# Patient Record
Sex: Male | Born: 1935 | Race: White | Hispanic: No | Smoking: Former smoker
Health system: Southern US, Community
[De-identification: ages and names within clinical notes are randomized; demographics above are authoritative.]

## PROBLEM LIST (undated history)

## (undated) HISTORY — PX: BACK SURGERY: SHX140

## (undated) HISTORY — PX: APPENDECTOMY: SHX54

## (undated) HISTORY — PX: HIP SURGERY: SHX245

## (undated) HISTORY — PX: KNEE SURGERY: SHX244

---

## 2018-12-22 ENCOUNTER — Other Ambulatory Visit: Payer: Self-pay

## 2018-12-22 ENCOUNTER — Emergency Department
Admission: EM | Admit: 2018-12-22 | Discharge: 2018-12-22 | Disposition: A | Discharge status: Home / Self Care | Payer: Medicare Other | Attending: Student | Admitting: Student

## 2018-12-22 ENCOUNTER — Emergency Department: Payer: Medicare Other

## 2018-12-22 ENCOUNTER — Encounter: Payer: Self-pay | Admitting: Emergency Medicine

## 2018-12-22 DIAGNOSIS — Y92129 Unspecified place in nursing home as the place of occurrence of the external cause: Secondary | ICD-10-CM | POA: Diagnosis not present

## 2018-12-22 DIAGNOSIS — Y999 Unspecified external cause status: Secondary | ICD-10-CM | POA: Diagnosis not present

## 2018-12-22 DIAGNOSIS — Y939 Activity, unspecified: Secondary | ICD-10-CM | POA: Insufficient documentation

## 2018-12-22 DIAGNOSIS — W010XXA Fall on same level from slipping, tripping and stumbling without subsequent striking against object, initial encounter: Secondary | ICD-10-CM | POA: Insufficient documentation

## 2018-12-22 DIAGNOSIS — W19XXXA Unspecified fall, initial encounter: Secondary | ICD-10-CM

## 2018-12-22 DIAGNOSIS — Z87891 Personal history of nicotine dependence: Secondary | ICD-10-CM | POA: Insufficient documentation

## 2018-12-22 DIAGNOSIS — S0990XA Unspecified injury of head, initial encounter: Secondary | ICD-10-CM | POA: Diagnosis present

## 2018-12-22 DIAGNOSIS — S0001XA Abrasion of scalp, initial encounter: Secondary | ICD-10-CM | POA: Insufficient documentation

## 2018-12-22 MED ORDER — ONDANSETRON 4 MG PO TBDP
4.0000 mg | ORAL_TABLET | Freq: Once | ORAL | Status: AC
  Administered 2018-12-22: 4 mg via ORAL
  Filled 2018-12-22: qty 1

## 2018-12-22 MED ORDER — ACETAMINOPHEN 500 MG PO TABS
1000.0000 mg | ORAL_TABLET | Freq: Once | ORAL | Status: AC
  Administered 2018-12-22: 08:00:00 1000 mg via ORAL
  Filled 2018-12-22: qty 2

## 2018-12-22 NOTE — ED Notes (Signed)
Attempted to call [facility] multiple times without answer.

## 2018-12-22 NOTE — ED Provider Notes (Signed)
[facility] Emergency Department Provider Note  ____________________________________________   First MD Initiated Contact with Patient 12/22/18 [phone]     (approximate)  I have reviewed the triage vital signs and the nursing notes.  History  Chief Complaint Fall    HPI Airik Goodlin is a 85 y.o. male who presents to the emergency department for a fall.  Patient states he was bending over to pick something up and lost his balance, falling backwards and hitting the posterior head.  He denies any loss of consciousness.  Patient states he wears bilateral leg braces and uses a walker due to a toe drop, and because of this he is often unsteady on his feet leading to falls.  He denies any preceding presyncopal symptoms, no chest pain, shortness of breath, lightheadedness, dizziness prior to the fall.  After the fall he has a mild headache and some associated lightheadedness.  Again, he denies any of these symptoms prior to the fall.  He is not on any blood thinning medications.  He denies any weakness, numbness, tingling, chest pain, neck pain, back pain, abdominal pain, or extremity pain.   Past Medical Hx History reviewed. No pertinent past medical history.  Problem List There are no active problems to display for this patient.   Past Surgical Hx Past Surgical History:  Procedure Laterality Date  . APPENDECTOMY    . BACK SURGERY    . HIP SURGERY Right   . KNEE SURGERY Right     Medications Prior to Admission medications   Not on File    Allergies Patient has no known allergies.  Family Hx History reviewed. No pertinent family history.  Social Hx Social History   Tobacco Use  . Smoking status: Former Games developer  . Smokeless tobacco: Never Used  Substance Use Topics  . Alcohol use: Never    Frequency: Never  . Drug use: Never     Review of Systems  Constitutional: Negative for fever, chills. Eyes: Negative for visual changes. ENT: Negative  for sore throat. Cardiovascular: Negative for chest pain. Respiratory: Negative for shortness of breath. Gastrointestinal: Negative for nausea, vomiting.  Genitourinary: Negative for dysuria. Musculoskeletal: Negative for leg swelling. Skin: + skin tear to left elbow Neurological: + for for headaches.   Physical Exam  Vital Signs: ED Triage Vitals  Enc Vitals Group     BP 12/22/18 0722 (!) 156/76     Pulse Rate 12/22/18 0722 65     Resp 12/22/18 0722 16     Temp 12/22/18 0722 97.7 F (36.5 C)     Temp Source 12/22/18 0722 Oral     SpO2 12/22/18 0722 97 %     Weight 12/22/18 0722 145 lb (65.8 kg)     Height 12/22/18 0722 5\' 6"  (1.676 m)     Head Circumference --      Peak Flow --      Pain Score 12/22/18 0723 5     Pain Loc --      Pain Edu? --      Excl. in GC? --     Constitutional: Alert and oriented.  Head: Normocephalic.  Mild abrasion to the center of the posterior occiput.  No lacerations. Eyes: Conjunctivae clear. Sclera anicteric. Nose: No congestion. No rhinorrhea. Mouth/Throat: Mucous membranes are moist.  Neck: No stridor.  No midline C-spine tenderness.  Full range of motion without discomfort. Cardiovascular: Normal rate, regular rhythm.  2+ symmetric radial pulses. Respiratory: Normal respiratory effort.  Lungs CTAB.  Chest: Chest wall stable and nontender.  No crepitance. Gastrointestinal: Soft. Non-tender. Non-distended.  Pelvis: Pelvis nontender and stable with AP and lateral compression. Musculoskeletal: No lower extremity edema. No deformities.  Full range of motion to bilateral shoulders, elbows, wrists, hips, knees.  Braces in place over bilateral ankles/feet. Back: No midline C/T/L-spine tenderness.  No step-offs or deformities. Neurologic:  Normal speech and language. No gross focal neurologic deficits are appreciated.  Skin: Very small skin tear to the right elbow area.  No lacerations amenable to suture repair. Psychiatric: Mood and affect are  appropriate for situation.  EKG  Personally reviewed.   Rate: 65 Rhythm: Sinus Axis: Normal Intervals: Within normal limits Sinus rhythm, no acute ischemic changes No STEMI    Radiology  CT head: IMPRESSION:  1. No acute intracranial findings.  2. Chronic microvascular ischemic change and cerebral volume loss.   CT EX:[id number]:  1. Limited exam.  2. No acute fracture or traumatic malalignment within the cervical spine.  3. Multilevel cervical spondylosis.   Procedures  Procedure(s) performed (including critical care):  Procedures   Initial Impression / Assessment and Plan / ED Course  85 y.o. male who presents to the ED for mechanical fall, as noted above.  On exam, he has a very small abrasion to the center of the posterior occiput and a small skin tear to the left elbow.   There are no lacerations amenable to suture repair.  We will plan for CT head and neck given his head injury.  He has full range of motion at the elbow, without deformities, and is neurovascularly intact, do not feel emergent imaging of this is indicated.  Patient is agreeable with the plan.  CT imaging negative for any acute traumatic injuries.  As such, will plan for discharge.  Given return precautions.  Patient voices understanding and is comfortable with the plan and discharge.    Final Clinical Impression(s) / ED Diagnosis  Final diagnoses:  Fall, initial encounter       Note:  This document was prepared using Dragon voice recognition software and may include unintentional dictation errors.   Lilia Pro., MD 12/22/18 [phone]

## 2018-12-22 NOTE — Discharge Instructions (Addendum)
Thank you for letting us take care of you in the emergency department today. Your CT scans were negative for any acute traumatic injuries.  Please continue to take any regular, prescribed medications.   Please return to the ER for any new or worsening symptoms.

## 2018-12-22 NOTE — ED Notes (Signed)
Patient transported to CT 

## 2018-12-22 NOTE — ED Notes (Signed)
Signature pad not working at this time.  Attempted multiple times.  Discharge instructions reviewed and patient verbalizes understanding with no further questions.

## 2018-12-22 NOTE — ED Triage Notes (Signed)
Pt to ED via EMS from [facility] c/o fall this morning.  States was bending over to pick something up and lost his balance, falling backwards and hit posterior head, denies LOC.  Pt denies being dizzy prior to fall but dizzy after and small abrasion to posterior head.  A&Ox4, takes a daily ASA, wears leg braces and uses walker at home and reports multiple falls and always unsteady on feet.

## 2018-12-22 NOTE — ED Notes (Signed)
This RN spoke with Stanton Kidney, daughter, with update and also spoke to [place] from [place] to give report and who stated will send transport for discharge.

## 2019-03-16 DEATH — deceased

## 2020-11-08 IMAGING — CT CT CERVICAL SPINE W/O CM
3 of 4 series · 9 of 33 positions shown, 11 images · non-contrast
Comparison: None.

CLINICAL DATA: Fall, neck pain

EXAM:
CT CERVICAL SPINE WITHOUT CONTRAST
TECHNIQUE: Multidetector CT imaging of the cervical spine was performed without
intravenous contrast. Multiplanar CT image reconstructions were also
generated.

[Series 5: sagittal bone · sagittal · 0.23mm/px · 5 of 58 slices shown, 6 images]
[im 20/58  bone]
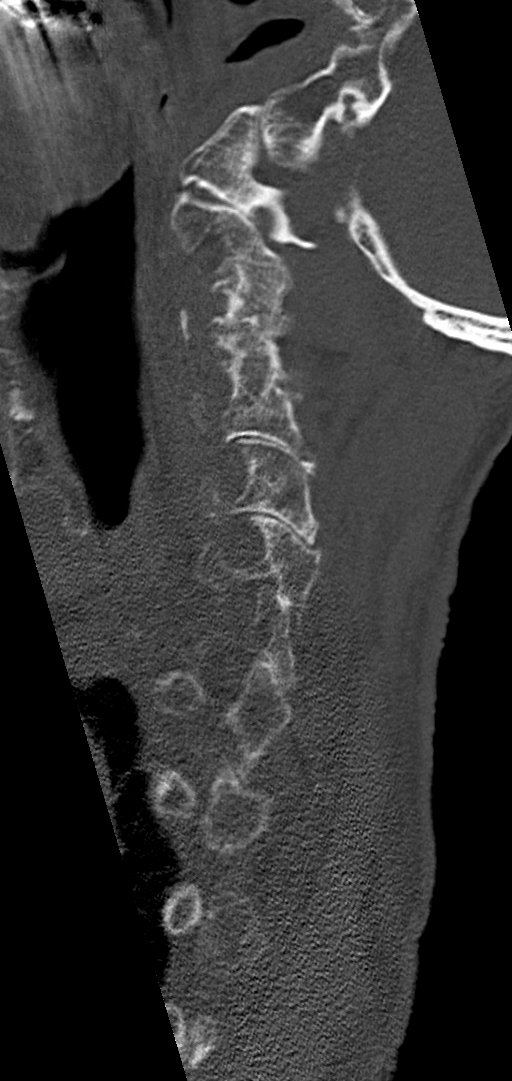
[im 24/58  bone]
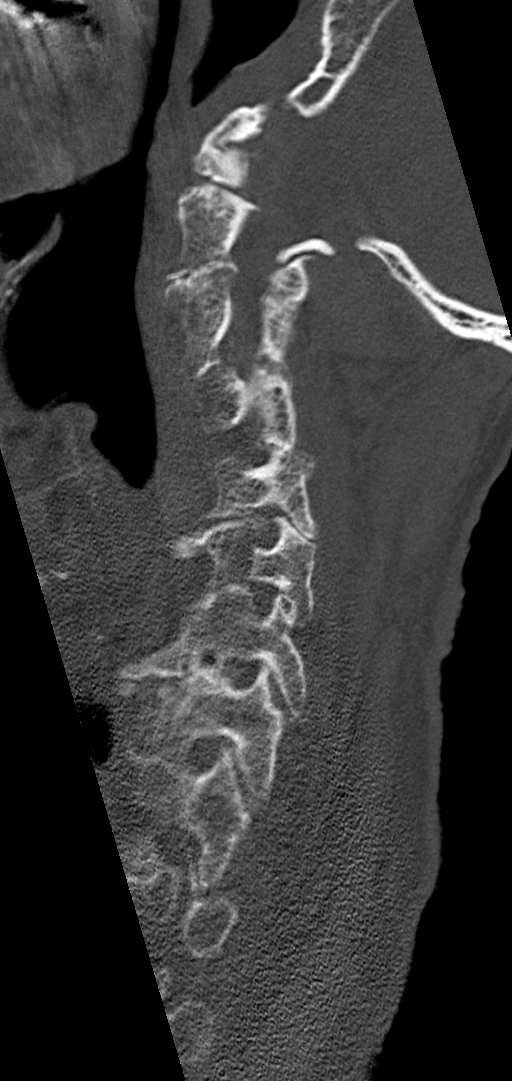
[im 29/58  soft-tissue]
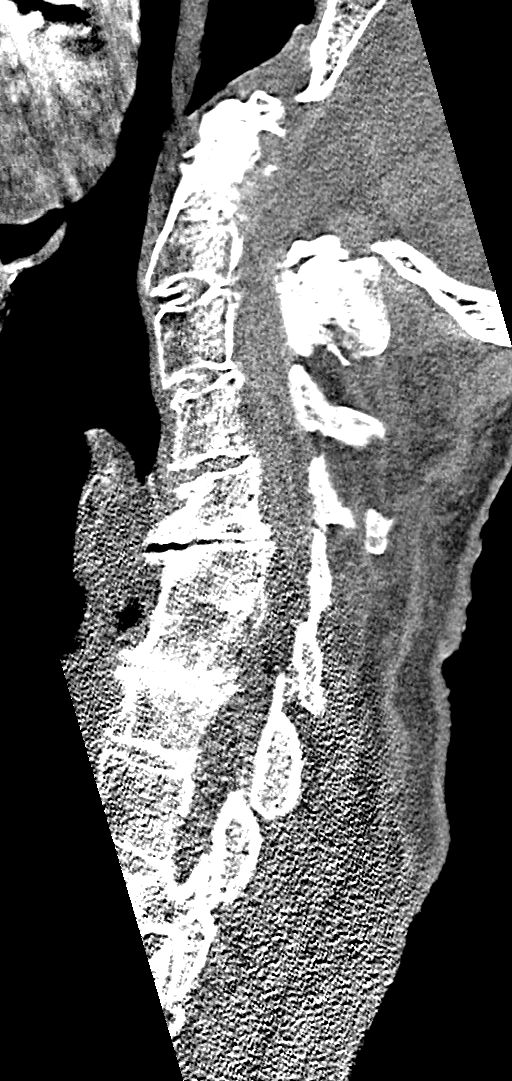
[im 29/58  bone]
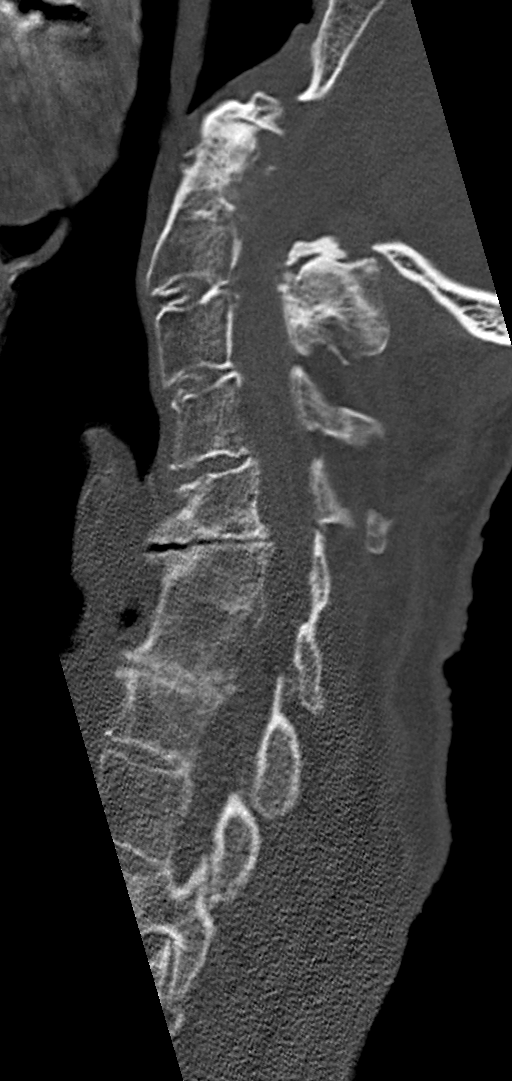
[im 34/58  bone]
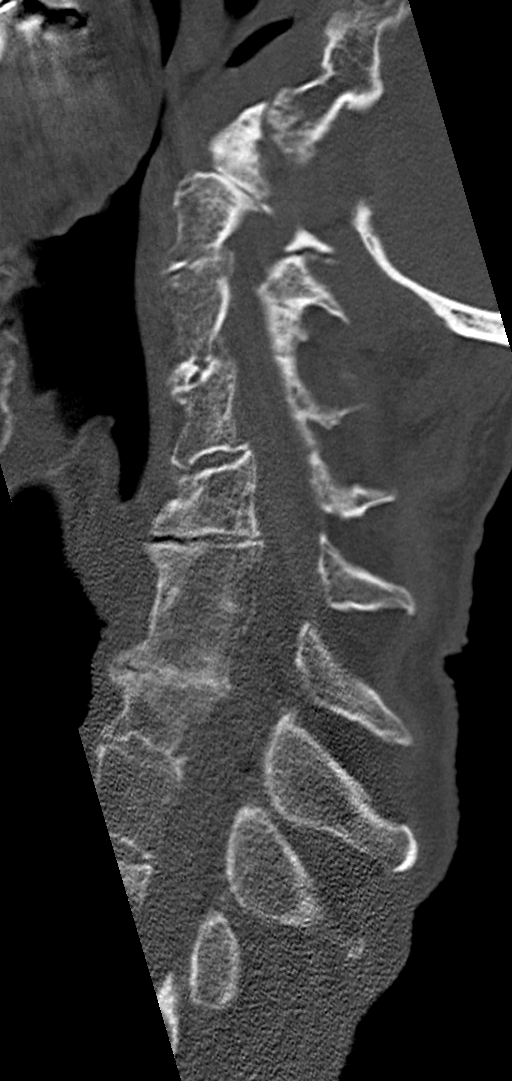
[im 39/58  bone]
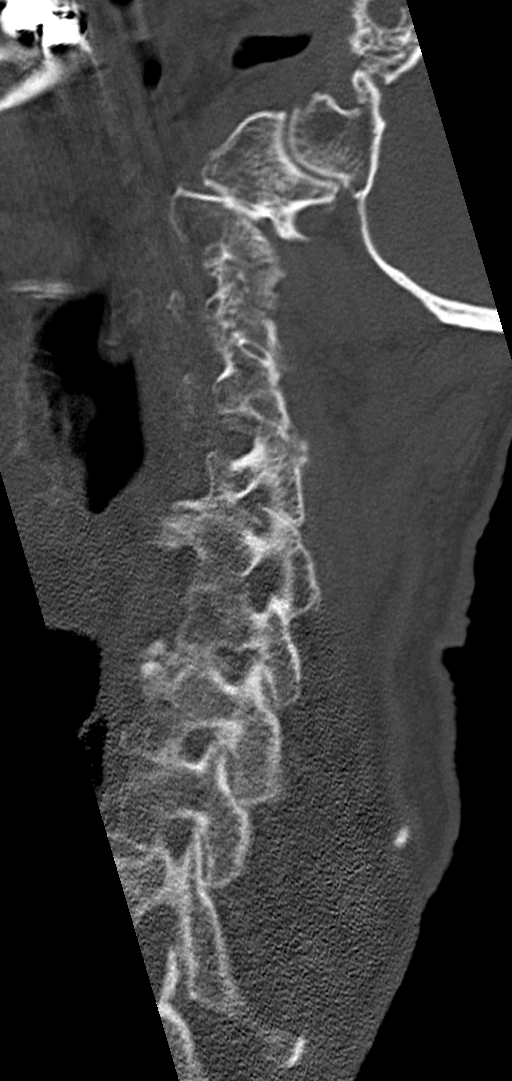

[Series 6: coronal bone · axial · 0.22mm/px · z∈[-163,-163]mm · 1 of 59 slices shown, 2 images]
[im 30/59  soft-tissue]
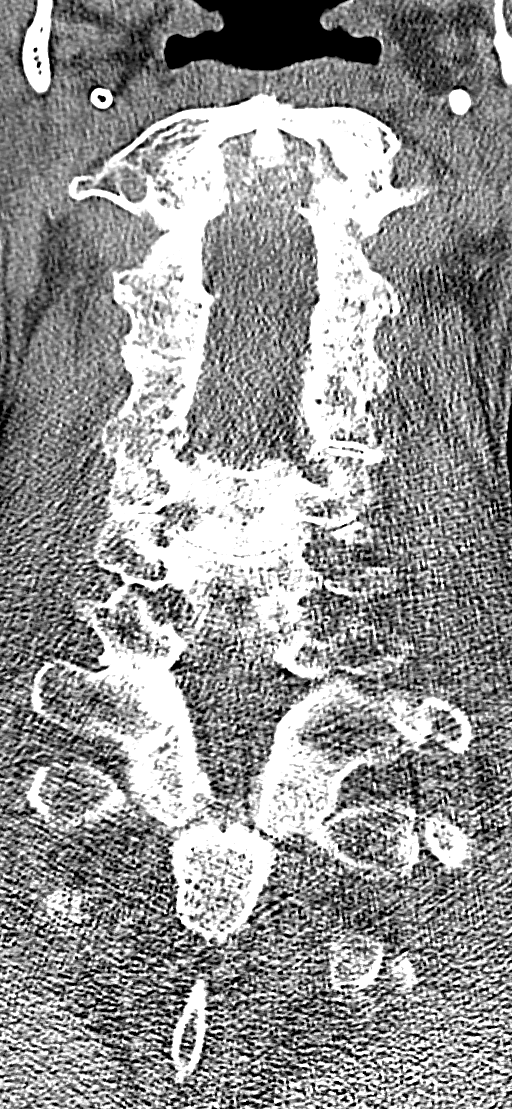
[im 30/59  bone]
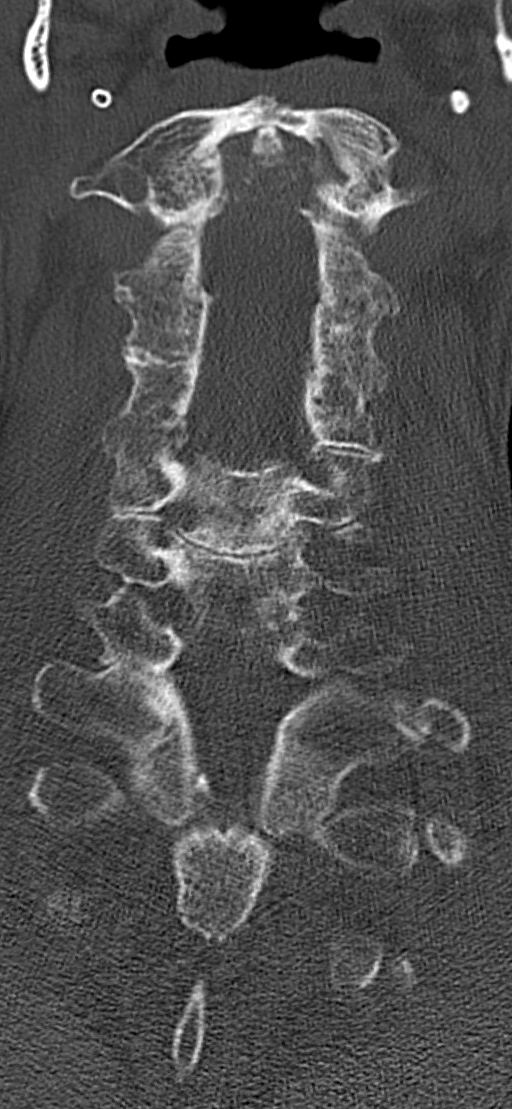

[Series 7: orthogonal bone · coronal · 0.22mm/px · 3 of 124 slices shown]
[im 36/124  bone]
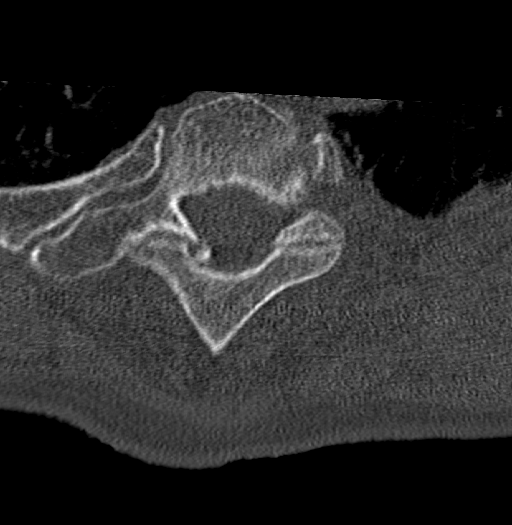
[im 53/124  bone]
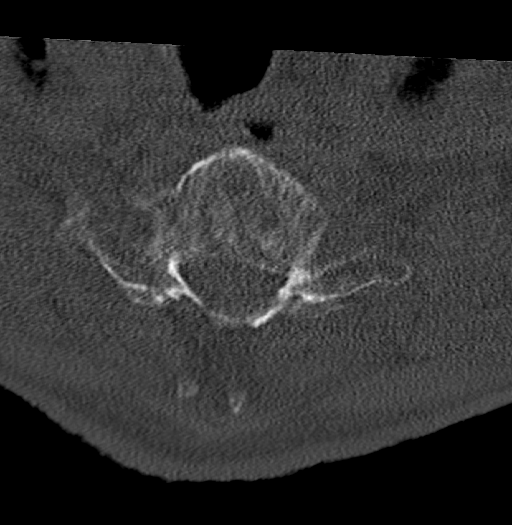
[im 71/124  bone]
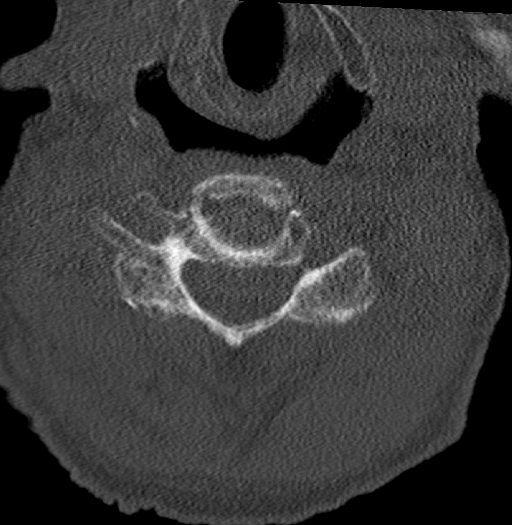

[9 of 33 positions shown; findings below may reference images not displayed]

FINDINGS: Beam hardening artifact related to overlapping tissues related the
patient positioning significantly degrades image quality.

Alignment: Reversal of the cervical lordosis. Grade 1
anterolisthesis C3 on C4, C4 on C5, and C5 on C6. Facet joints
remain aligned without dislocation.

Skull base and vertebrae: No acute fracture identified. Partial bony
ankylosis of the C6 and C7 vertebral bodies. Multiple partially
fused facet joints within the upper cervical spine including
bilaterally at C2-C3.

Soft tissues and spinal canal: Prevertebral soft tissues are not
thickened. Limited evaluation of the spinal canal reveals no visible
hematoma.

Disc levels: Multilevel degenerative changes throughout the cervical
spine with disc height loss and advanced facet and uncovertebral
arthropathy.

Upper chest: Biapical pleuroparenchymal scarring, right worse than
left.

Other: None.
IMPRESSION: 1. Limited exam.
2. No acute fracture or traumatic malalignment within the cervical
spine.
3. Multilevel cervical spondylosis.

## 2020-11-08 IMAGING — CT CT HEAD W/O CM
3 of 5 series · 15 of 47 positions shown, 18 images · non-contrast
Comparison: None.

CLINICAL DATA: Fall, headache

EXAM:
CT HEAD WITHOUT CONTRAST
TECHNIQUE: Contiguous axial images were obtained from the base of the skull
through the vertex without intravenous contrast.

[Series 6: head wo · axial · 0.46mm/px · z∈[-154,-24]mm · 9 of 32 slices shown, 12 images]
[im 3/32  brain]
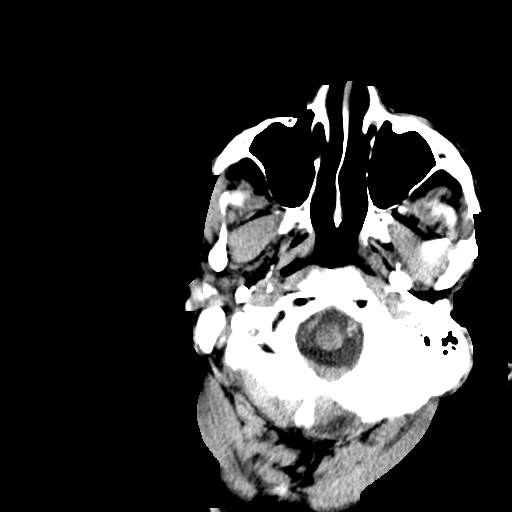
[im 3/32  bone]
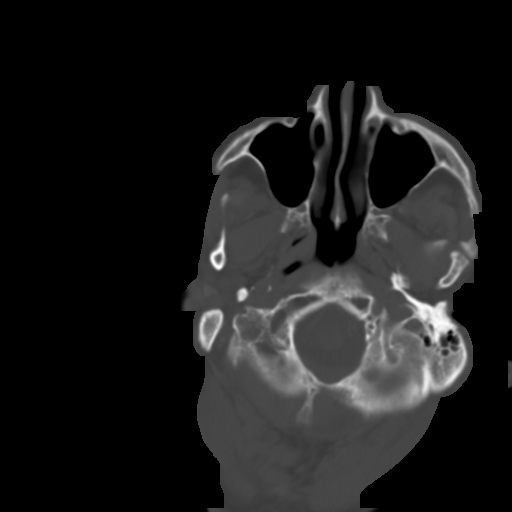
[im 6/32  brain]
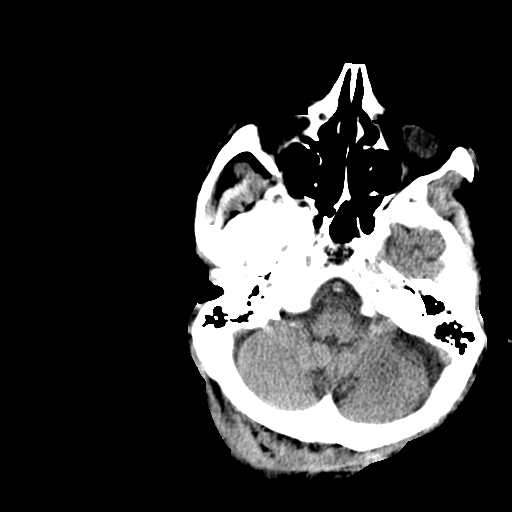
[im 9/32  brain]
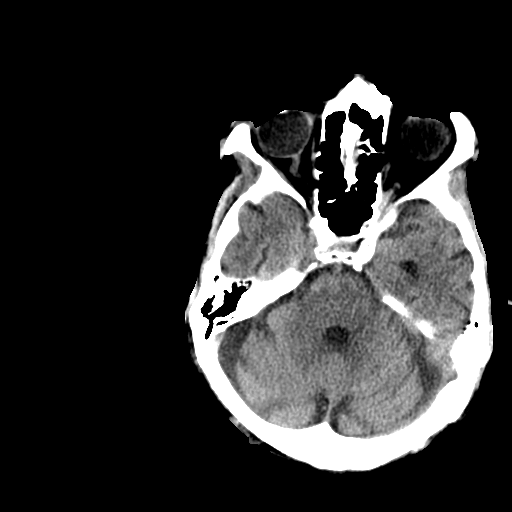
[im 12/32  brain]
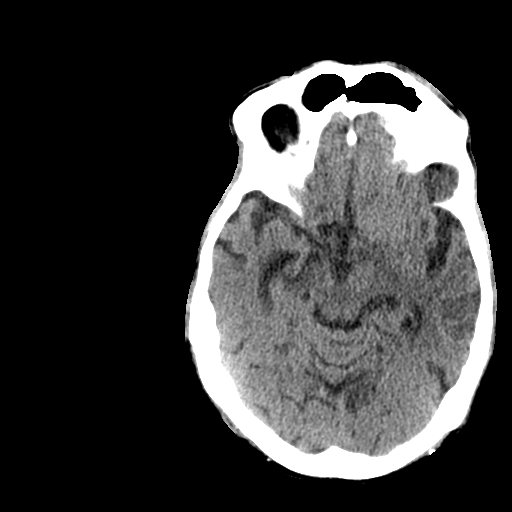
[im 17/32  brain]
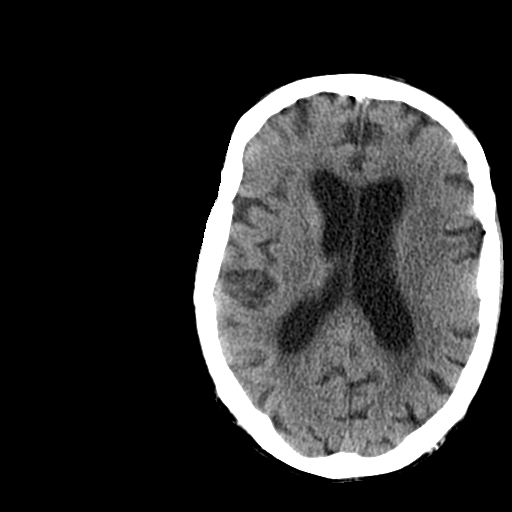
[im 17/32  bone]
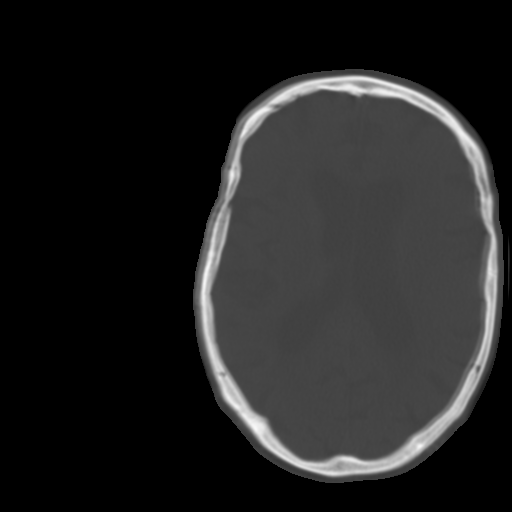
[im 20/32  brain]
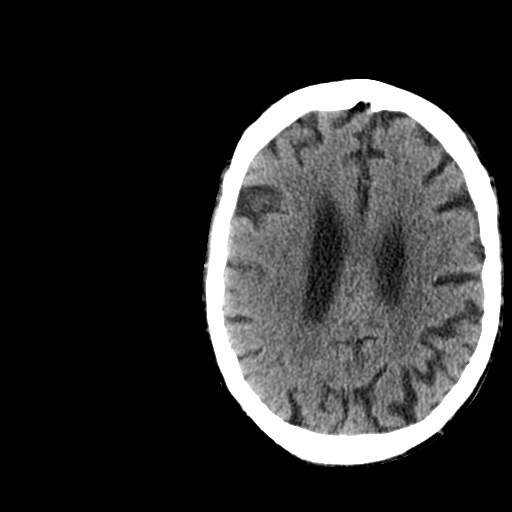
[im 23/32  brain]
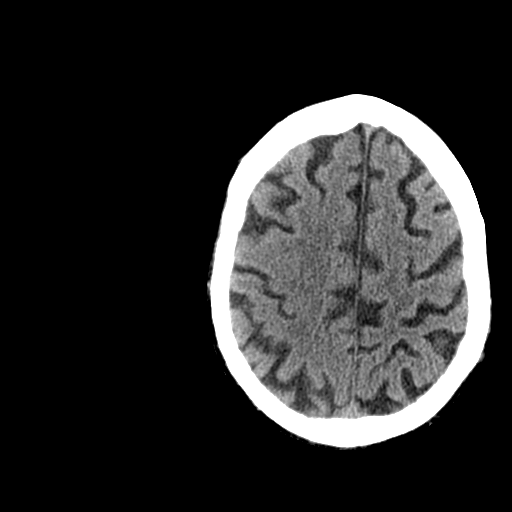
[im 26/32  brain]
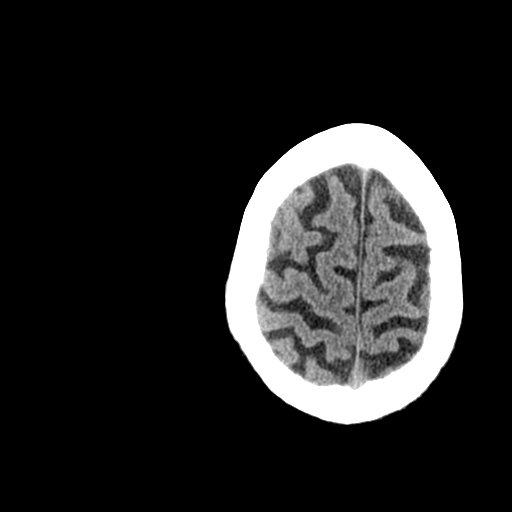
[im 29/32  brain]
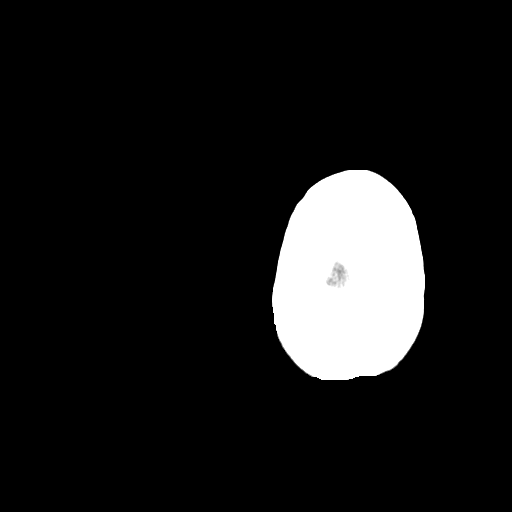
[im 29/32  bone]
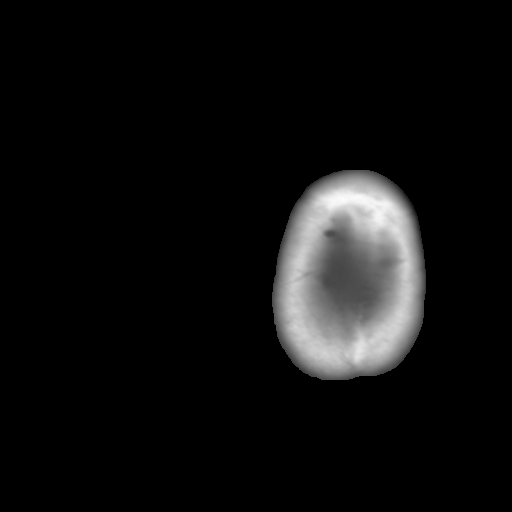

[Series 9: coronal soft tissue · coronal · 0.29mm/px · 3 of 72 slices shown]
[im 24/72  brain]
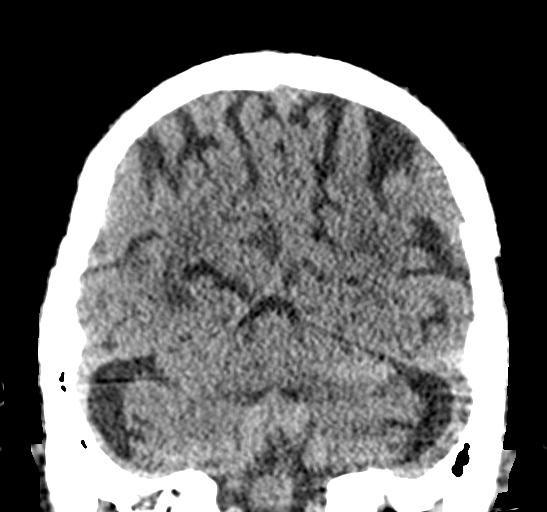
[im 32/72  brain]
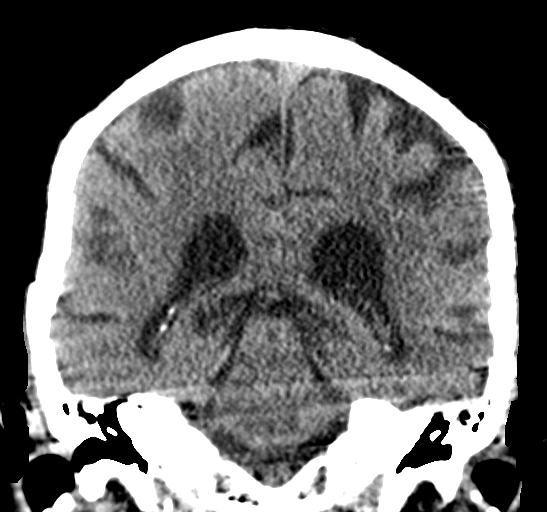
[im 40/72  brain]
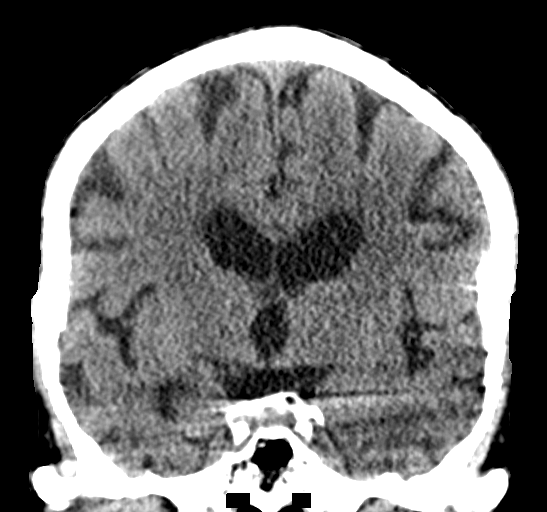

[Series 10: sagittal soft tissue · sagittal · 0.29mm/px · 3 of 53 slices shown]
[im 18/53  brain]
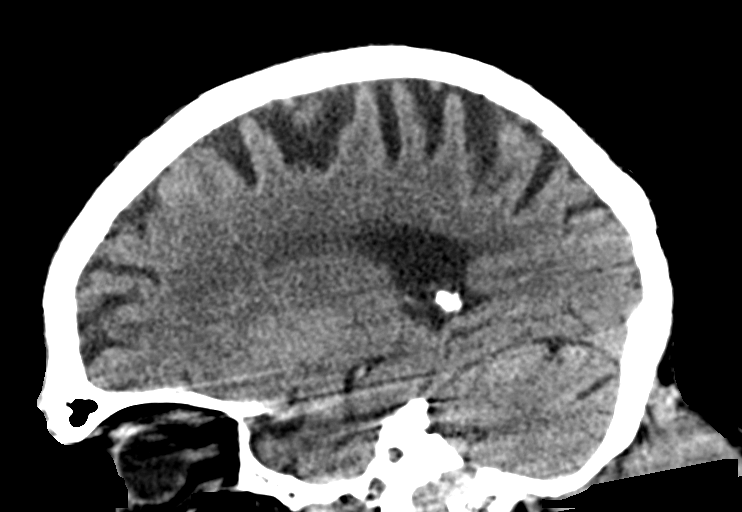
[im 27/53  brain]
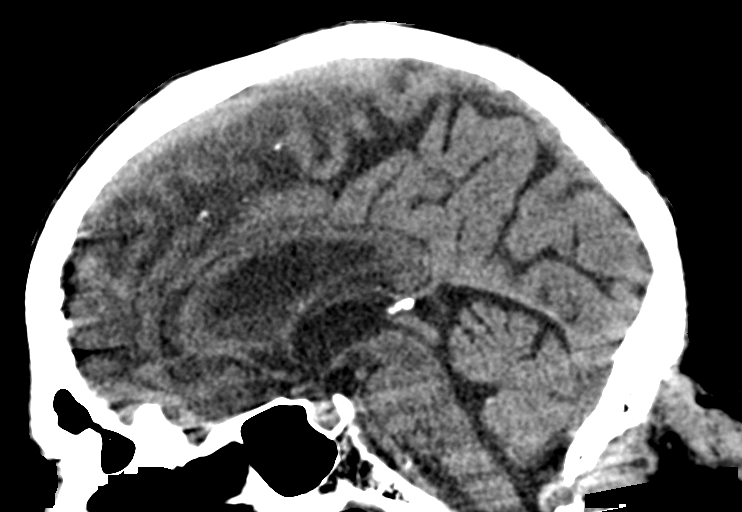
[im 35/53  brain]
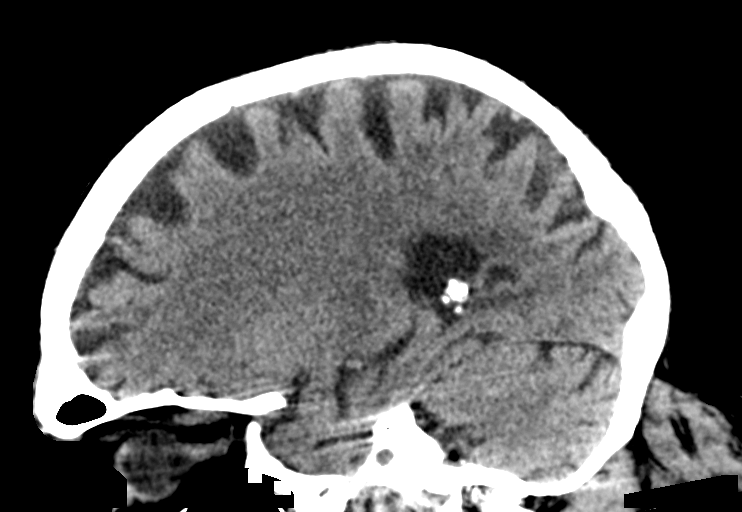

[15 of 47 positions shown; findings below may reference images not displayed]

FINDINGS: Brain: No evidence of acute infarction, hemorrhage, hydrocephalus,
extra-axial collection or mass lesion/mass effect. Moderate
low-density changes within the periventricular and subcortical white
matter compatible with chronic microvascular ischemic change.
Moderate diffuse cerebral volume loss.

Vascular: Mild atherosclerotic calcifications involving the large
vessels of the skull base. No unexpected hyperdense vessel.

Skull: Normal. Negative for fracture or focal lesion.

Sinuses/Orbits: No acute finding.

Other: None.
IMPRESSION: 1.  No acute intracranial findings.

2.  Chronic microvascular ischemic change and cerebral volume loss.
